# Patient Record
Sex: Female | Born: 1960 | Race: Black or African American | Hispanic: No | Marital: Married | Smoking: Never smoker
Health system: Southern US, Community
[De-identification: ages and names within clinical notes are randomized; demographics above are authoritative.]

## PROBLEM LIST (undated history)

## (undated) HISTORY — PX: ABDOMINAL HYSTERECTOMY: SHX81

---

## 2010-11-19 DIAGNOSIS — M722 Plantar fascial fibromatosis: Secondary | ICD-10-CM | POA: Insufficient documentation

## 2011-08-06 ENCOUNTER — Other Ambulatory Visit: Payer: Self-pay | Admitting: Physician Assistant

## 2011-08-06 DIAGNOSIS — Z1231 Encounter for screening mammogram for malignant neoplasm of breast: Secondary | ICD-10-CM

## 2011-08-29 ENCOUNTER — Ambulatory Visit
Admission: RE | Admit: 2011-08-29 | Discharge: 2011-08-29 | Disposition: A | Discharge status: Home / Self Care | Payer: PRIVATE HEALTH INSURANCE | Source: Ambulatory Visit | Attending: Physician Assistant | Admitting: Physician Assistant

## 2011-08-29 DIAGNOSIS — Z1231 Encounter for screening mammogram for malignant neoplasm of breast: Secondary | ICD-10-CM

## 2012-09-14 ENCOUNTER — Other Ambulatory Visit: Payer: Self-pay

## 2012-09-14 DIAGNOSIS — Z1231 Encounter for screening mammogram for malignant neoplasm of breast: Secondary | ICD-10-CM

## 2012-09-27 ENCOUNTER — Ambulatory Visit
Admission: RE | Admit: 2012-09-27 | Discharge: 2012-09-27 | Disposition: A | Discharge status: Home / Self Care | Payer: PRIVATE HEALTH INSURANCE | Source: Ambulatory Visit

## 2012-09-27 DIAGNOSIS — Z1231 Encounter for screening mammogram for malignant neoplasm of breast: Secondary | ICD-10-CM

## 2013-06-14 ENCOUNTER — Encounter: Payer: Self-pay | Admitting: Physician Assistant

## 2013-06-14 ENCOUNTER — Ambulatory Visit (INDEPENDENT_AMBULATORY_CARE_PROVIDER_SITE_OTHER): Payer: PRIVATE HEALTH INSURANCE

## 2013-06-14 ENCOUNTER — Ambulatory Visit (INDEPENDENT_AMBULATORY_CARE_PROVIDER_SITE_OTHER): Payer: PRIVATE HEALTH INSURANCE | Admitting: Physician Assistant

## 2013-06-14 VITALS — BP 109/74 | HR 81 | Ht 60.0 in | Wt 182.0 lb

## 2013-06-14 DIAGNOSIS — R140 Abdominal distension (gaseous): Secondary | ICD-10-CM

## 2013-06-14 DIAGNOSIS — R1031 Right lower quadrant pain: Secondary | ICD-10-CM

## 2013-06-14 DIAGNOSIS — K5900 Constipation, unspecified: Secondary | ICD-10-CM

## 2013-06-14 DIAGNOSIS — R635 Abnormal weight gain: Secondary | ICD-10-CM

## 2013-06-14 DIAGNOSIS — R143 Flatulence: Secondary | ICD-10-CM

## 2013-06-14 DIAGNOSIS — E669 Obesity, unspecified: Secondary | ICD-10-CM

## 2013-06-14 DIAGNOSIS — R142 Eructation: Secondary | ICD-10-CM

## 2013-06-14 DIAGNOSIS — R141 Gas pain: Secondary | ICD-10-CM

## 2013-06-14 DIAGNOSIS — K5909 Other constipation: Secondary | ICD-10-CM

## 2013-06-14 MED ORDER — PHENTERMINE-TOPIRAMATE ER 7.5-46 MG PO CP24: 1.0000 | ORAL_CAPSULE | Freq: Every morning | ORAL | Status: DC

## 2013-06-14 MED ORDER — PHENTERMINE-TOPIRAMATE ER 3.75-23 MG PO CP24: ORAL_CAPSULE | ORAL | Status: DC

## 2013-06-14 NOTE — Patient Instructions (Signed)
Start qsymia for weight loss daily.   Get abdominal xray. Start miralax 1 capful twice a day for 5 days then once a day as needed.   Constipation, Adult Constipation is when a person has fewer than 3 bowel movements a week; has difficulty having a bowel movement; or has stools that are dry, hard, or larger than normal. As people grow older, constipation is more common. If you try to fix constipation with medicines that make you have a bowel movement (laxatives), the problem may get worse. Long-term laxative use may cause the muscles of the colon to become weak. A low-fiber diet, not taking in enough fluids, and taking certain medicines may make constipation worse. CAUSES   Certain medicines, such as antidepressants, pain medicine, iron supplements, antacids, and water pills.   Certain diseases, such as diabetes, irritable bowel syndrome (IBS), thyroid disease, or depression.   Not drinking enough water.   Not eating enough fiber-rich foods.   Stress or travel.  Lack of physical activity or exercise.  Not going to the restroom when there is the urge to have a bowel movement.  Ignoring the urge to have a bowel movement.  Using laxatives too much. SYMPTOMS   Having fewer than 3 bowel movements a week.   Straining to have a bowel movement.   Having hard, dry, or larger than normal stools.   Feeling full or bloated.   Pain in the lower abdomen.  Not feeling relief after having a bowel movement. DIAGNOSIS  Your caregiver will take a medical history and perform a physical exam. Further testing may be done for severe constipation. Some tests may include:   A barium enema X-ray to examine your rectum, colon, and sometimes, your small intestine.  A sigmoidoscopy to examine your lower colon.  A colonoscopy to examine your entire colon. TREATMENT  Treatment will depend on the severity of your constipation and what is causing it. Some dietary treatments include drinking more  fluids and eating more fiber-rich foods. Lifestyle treatments may include regular exercise. If these diet and lifestyle recommendations do not help, your caregiver may recommend taking over-the-counter laxative medicines to help you have bowel movements. Prescription medicines may be prescribed if over-the-counter medicines do not work.  HOME CARE INSTRUCTIONS   Increase dietary fiber in your diet, such as fruits, vegetables, whole grains, and beans. Limit high-fat and processed sugars in your diet, such as Jamaica[place] fries, hamburgers, cookies, candies, and soda.   A fiber supplement may be added to your diet if you cannot get enough fiber from foods.   Drink enough fluids to keep your urine clear or pale yellow.   Exercise regularly or as directed by your caregiver.   Go to the restroom when you have the urge to go. Do not hold it.  Only take medicines as directed by your caregiver. Do not take other medicines for constipation without talking to your caregiver first. SEEK IMMEDIATE MEDICAL CARE IF:   You have bright red blood in your stool.   Your constipation lasts for more than 4 days or gets worse.   You have abdominal or rectal pain.   You have thin, pencil-like stools.  You have unexplained weight loss. MAKE SURE YOU:   Understand these instructions.  Will watch your condition.  Will get help right away if you are not doing well or get worse. Document Released: 10/26/2003 Document Revised: 04/21/2011 Document Reviewed: 11/08/2012 [facility] Patient Information 2014 [employer], [place].

## 2013-06-17 DIAGNOSIS — K5900 Constipation, unspecified: Secondary | ICD-10-CM | POA: Insufficient documentation

## 2013-06-17 DIAGNOSIS — E669 Obesity, unspecified: Secondary | ICD-10-CM | POA: Insufficient documentation

## 2013-06-17 DIAGNOSIS — R140 Abdominal distension (gaseous): Secondary | ICD-10-CM | POA: Insufficient documentation

## 2013-06-17 DIAGNOSIS — R635 Abnormal weight gain: Secondary | ICD-10-CM | POA: Insufficient documentation

## 2013-06-17 NOTE — Progress Notes (Signed)
Subjective:    Patient ID: Sonia Blair, female    DOB: 1960-12-20, 53 y.o.   MRN: [id number]  HPI Pt is a 53 yo female who presents to the clinic to establish care.   . Active Ambulatory Problems    Diagnosis Date Noted  . No Active Ambulatory Problems   Resolved Ambulatory Problems    Diagnosis Date Noted  . No Resolved Ambulatory Problems   No Additional Past Medical History   . Family History  Problem Relation Age of Onset  . Cancer Maternal Grandmother    . History   Social History  . Marital Status: Married    Spouse Name: N/A    Number of Children: N/A  . Years of Education: N/A   Occupational History  . Not on file.   Social History Main Topics  . Smoking status: Never Smoker   . Smokeless tobacco: Not on file  . Alcohol Use: No  . Drug Use: No  . Sexual Activity: Yes   Other Topics Concern  . Not on file   Social History Narrative  . No narrative on file   Pt is having ongoing bloating and constipation. It is not abnormal for pt to go 4-5 days without a bowel movement. Last bowel movement was 4 days ago and hard. Colace has worked well for many years but has seemed to stop working. She is getting some RLQ discomfort now. No fever, chills. No blood in stool. No n/v/d.   Pt is also concerned about her weight. She is very discouraged. She feels like she eats small portions and trys to make good choices. She eats lots of fruit and good protein. She even got a Systems analystpersonal trainer for a few months with no results. She has not tried any medication for weight loss.    Review of Systems  All other systems reviewed and are negative.      Objective:   Physical Exam  Constitutional: She is oriented to person, place, and time. She appears well-developed and well-nourished.  HENT:  Head: Normocephalic and atraumatic.  Eyes: Conjunctivae are normal.  Neck: Normal range of motion. Neck supple. No thyromegaly present.  Cardiovascular: Normal rate, regular  rhythm and normal heart sounds.   Pulmonary/Chest: Effort normal and breath sounds normal.  Abdominal: She exhibits no mass. There is no rebound and no guarding.  Abdomen slightly distended. Some discomfort to palpation over RLQ. No overt pain. No masses or organomegaly palpated.   Neurological: She is alert and oriented to person, place, and time.  Psychiatric: She has a normal mood and affect. Her behavior is normal.          Assessment & Plan:  Bloating/constipation/right lower quadrant pain- pain suggestive that is coming from constipation. Will get abdominal xrays today. Will order TSH due to weight issues and constipation could be some underlying cause. Suggested pt to try miralax 1 capful twice a day for 5 days then decrease to once a day or as needed. If not creating full bowel movements and if bloating and discomfort persist would like to be made aware so we could try other agents or potentially get ultrasound. She does take iron which could be a cause of some constipation. Stay on for now but made need to do a trial without.   Abnormal weight gain/obesity- she has not tried anything prescription for weight loss and I feel she would be a good candidate for qsymia. Gave her the card for 3 months free. Encouraged  her to sign up for exercise/texting program that is included. Encouraged pt strive for 150minutes of exercise a week and 1200 calorie diet. Side effects discussed. Recheck in 4-6 weeks.   Pt's last labs were 2014. Encouraged CPE sometime this year to address other health maintence items. Pt is aware she needs Tdap.

## 2013-07-13 ENCOUNTER — Ambulatory Visit (INDEPENDENT_AMBULATORY_CARE_PROVIDER_SITE_OTHER): Payer: PRIVATE HEALTH INSURANCE | Admitting: Physician Assistant

## 2013-07-13 ENCOUNTER — Encounter: Payer: Self-pay | Admitting: Physician Assistant

## 2013-07-13 VITALS — BP 123/88 | HR 69 | Ht 60.0 in | Wt 177.0 lb

## 2013-07-13 DIAGNOSIS — R011 Cardiac murmur, unspecified: Secondary | ICD-10-CM

## 2013-07-13 DIAGNOSIS — R635 Abnormal weight gain: Secondary | ICD-10-CM

## 2013-07-13 DIAGNOSIS — E669 Obesity, unspecified: Secondary | ICD-10-CM

## 2013-07-13 MED ORDER — PHENTERMINE-TOPIRAMATE ER 7.5-46 MG PO CP24: 1.0000 | ORAL_CAPSULE | Freq: Every morning | ORAL | Status: DC

## 2013-07-13 NOTE — Progress Notes (Signed)
   Subjective:    Patient ID: Sonia Blair, female    DOB: 07-Jul-1960, 53 y.o.   MRN: [id number]  HPI Patient is a 53 year old female who presents to the clinic to followup on qsymia. She is doing well on medication he reports no side effects. Denies feeling any palpitations, nausea. She does feel like she is making better decisions. She does not see that it increases her energy or motivation however she is still exercising regularly at least 3-4 times a week for 30 minutes to an hour. She does feel like her clothes are fitting a little looser. She is significantly decreased her sugar and Karp intake. She states that "just does not taste the same". Patient is down 5 pounds today in 4 weeks.      Review of Systems  All other systems reviewed and are negative.      Objective:   Physical Exam  Constitutional: She is oriented to person, place, and time. She appears well-developed and well-nourished.  HENT:  Head: Normocephalic and atraumatic.  Cardiovascular: Normal rate and regular rhythm.   3/6 systolic heart murmur best over aortic site.   Pulmonary/Chest: Effort normal and breath sounds normal. She has no wheezes.  Neurological: She is alert and oriented to person, place, and time.  Skin: Skin is dry.  Psychiatric: She has a normal mood and affect. Her behavior is normal.          Assessment & Plan:  Systolic heart murmur- per patient she has been told in the past she has a heart murmur but has not been mentioned in numerous office visits with other providers. She denies any shortness of breath, fatigue or changes in insurance. She denies any chest pains or palpitations. Discuss with patient I do not remember recording at last visit however did not mean that there was not a slight murmur. Will evaluate with an echo. Discussed red clots or concerning symptoms with patient. If she has any of these symptoms she is to call office.  Abnormal weight gain/obesity- refilled qsymia -for  another 2 months. Encouraged patient to continue pushing it with diet and exercise. Reassured patient that some of the maximum what caused occurred at around 3 months with this particular medication.

## 2013-07-15 DIAGNOSIS — R011 Cardiac murmur, unspecified: Secondary | ICD-10-CM | POA: Insufficient documentation

## 2013-07-30 ENCOUNTER — Encounter: Payer: Self-pay | Admitting: Physician Assistant

## 2013-08-17 ENCOUNTER — Telehealth: Payer: Self-pay | Admitting: *Deleted

## 2013-08-17 NOTE — Telephone Encounter (Signed)
Echo approved through [place] auth effective 08/11/13-10/10/13. Ref# [id number]. Corliss SkainsJamie Maricarmen Braziel, CMA

## 2013-08-24 ENCOUNTER — Other Ambulatory Visit (HOSPITAL_COMMUNITY): Payer: Self-pay | Admitting: Physician Assistant

## 2013-08-24 ENCOUNTER — Ambulatory Visit (HOSPITAL_BASED_OUTPATIENT_CLINIC_OR_DEPARTMENT_OTHER)
Admission: RE | Admit: 2013-08-24 | Discharge: 2013-08-24 | Disposition: A | Discharge status: Home / Self Care | Payer: PRIVATE HEALTH INSURANCE | Source: Ambulatory Visit | Attending: Physician Assistant | Admitting: Physician Assistant

## 2013-08-24 DIAGNOSIS — R011 Cardiac murmur, unspecified: Secondary | ICD-10-CM

## 2013-08-24 DIAGNOSIS — I059 Rheumatic mitral valve disease, unspecified: Secondary | ICD-10-CM

## 2013-08-25 ENCOUNTER — Other Ambulatory Visit: Payer: Self-pay | Admitting: Physician Assistant

## 2013-08-30 ENCOUNTER — Other Ambulatory Visit: Payer: Self-pay

## 2013-08-30 DIAGNOSIS — Z1231 Encounter for screening mammogram for malignant neoplasm of breast: Secondary | ICD-10-CM

## 2013-09-05 ENCOUNTER — Encounter: Payer: Self-pay | Admitting: Physician Assistant

## 2013-09-12 ENCOUNTER — Ambulatory Visit (INDEPENDENT_AMBULATORY_CARE_PROVIDER_SITE_OTHER): Payer: PRIVATE HEALTH INSURANCE | Admitting: Physician Assistant

## 2013-09-12 VITALS — BP 112/73 | HR 73 | Wt 168.0 lb

## 2013-09-12 DIAGNOSIS — R635 Abnormal weight gain: Secondary | ICD-10-CM

## 2013-09-12 DIAGNOSIS — E669 Obesity, unspecified: Secondary | ICD-10-CM

## 2013-09-12 MED ORDER — PHENTERMINE-TOPIRAMATE ER 7.5-46 MG PO CP24: 1.0000 | ORAL_CAPSULE | Freq: Every morning | ORAL | Status: DC

## 2013-09-12 NOTE — Progress Notes (Signed)
   Subjective:    Patient ID: Sonia Blair, female    DOB: Jun 15, 1960, 53 y.o.   MRN: [id number] Pt in today for weight/bp check.  She has no complaints of any side effects.  Donne AnonAmber Darleny Sem, CMA HPI    Review of Systems     Objective:   Physical Exam        Assessment & Plan:  Obesity/abnormal weight gain- lost 9lbs. Refilled for 3 months. Follow up then. Continue diet/exericse. Vitals look great. Tandy GawJade Breeback PA-c

## 2013-10-03 ENCOUNTER — Ambulatory Visit
Admission: RE | Admit: 2013-10-03 | Discharge: 2013-10-03 | Disposition: A | Discharge status: Home / Self Care | Payer: PRIVATE HEALTH INSURANCE | Source: Ambulatory Visit

## 2013-10-03 DIAGNOSIS — Z1231 Encounter for screening mammogram for malignant neoplasm of breast: Secondary | ICD-10-CM

## 2013-10-14 ENCOUNTER — Encounter: Payer: Self-pay | Admitting: Physician Assistant

## 2013-10-18 ENCOUNTER — Other Ambulatory Visit: Payer: Self-pay | Admitting: Physician Assistant

## 2013-10-18 MED ORDER — PHENTERMINE-TOPIRAMATE ER 7.5-46 MG PO CP24: 1.0000 | ORAL_CAPSULE | Freq: Every morning | ORAL | Status: DC

## 2013-10-19 ENCOUNTER — Other Ambulatory Visit: Payer: Self-pay | Admitting: Physician Assistant

## 2013-10-19 MED ORDER — PHENTERMINE-TOPIRAMATE ER 7.5-46 MG PO CP24
1.0000 | ORAL_CAPSULE | Freq: Every morning | ORAL | Status: AC
Start: 2013-10-19 — End: ?

## 2013-12-22 ENCOUNTER — Encounter: Payer: Self-pay | Admitting: Physician Assistant

## 2014-09-28 ENCOUNTER — Other Ambulatory Visit: Payer: Self-pay

## 2014-09-28 DIAGNOSIS — Z1231 Encounter for screening mammogram for malignant neoplasm of breast: Secondary | ICD-10-CM

## 2014-10-17 ENCOUNTER — Ambulatory Visit: Payer: PRIVATE HEALTH INSURANCE

## 2014-10-30 ENCOUNTER — Ambulatory Visit: Payer: PRIVATE HEALTH INSURANCE

## 2014-11-08 ENCOUNTER — Ambulatory Visit
Admission: RE | Admit: 2014-11-08 | Discharge: 2014-11-08 | Disposition: A | Discharge status: Home / Self Care | Payer: 59 | Source: Ambulatory Visit

## 2014-11-08 DIAGNOSIS — Z1231 Encounter for screening mammogram for malignant neoplasm of breast: Secondary | ICD-10-CM

## 2015-11-19 ENCOUNTER — Other Ambulatory Visit: Payer: Self-pay | Admitting: Obstetrics and Gynecology

## 2015-11-19 DIAGNOSIS — Z1231 Encounter for screening mammogram for malignant neoplasm of breast: Secondary | ICD-10-CM

## 2015-12-03 ENCOUNTER — Ambulatory Visit
Admission: RE | Admit: 2015-12-03 | Discharge: 2015-12-03 | Disposition: A | Discharge status: Home / Self Care | Payer: 59 | Source: Ambulatory Visit | Attending: Obstetrics and Gynecology | Admitting: Obstetrics and Gynecology

## 2015-12-03 DIAGNOSIS — Z1231 Encounter for screening mammogram for malignant neoplasm of breast: Secondary | ICD-10-CM

## 2016-02-22 DIAGNOSIS — Z862 Personal history of diseases of the blood and blood-forming organs and certain disorders involving the immune mechanism: Secondary | ICD-10-CM | POA: Insufficient documentation

## 2016-12-23 ENCOUNTER — Other Ambulatory Visit: Payer: Self-pay | Admitting: Internal Medicine

## 2016-12-23 DIAGNOSIS — Z1231 Encounter for screening mammogram for malignant neoplasm of breast: Secondary | ICD-10-CM

## 2017-01-20 ENCOUNTER — Ambulatory Visit
Admission: RE | Admit: 2017-01-20 | Discharge: 2017-01-20 | Disposition: A | Discharge status: Home / Self Care | Payer: 59 | Source: Ambulatory Visit | Attending: Internal Medicine | Admitting: Internal Medicine

## 2017-01-20 DIAGNOSIS — Z1231 Encounter for screening mammogram for malignant neoplasm of breast: Secondary | ICD-10-CM

## 2017-09-07 ENCOUNTER — Ambulatory Visit (INDEPENDENT_AMBULATORY_CARE_PROVIDER_SITE_OTHER): Payer: 59 | Admitting: Podiatry

## 2017-09-07 ENCOUNTER — Other Ambulatory Visit: Payer: Self-pay | Admitting: Podiatry

## 2017-09-07 ENCOUNTER — Ambulatory Visit (INDEPENDENT_AMBULATORY_CARE_PROVIDER_SITE_OTHER): Payer: 59

## 2017-09-07 ENCOUNTER — Encounter: Payer: Self-pay | Admitting: Podiatry

## 2017-09-07 VITALS — BP 98/65 | HR 85 | Resp 16

## 2017-09-07 DIAGNOSIS — M79671 Pain in right foot: Secondary | ICD-10-CM | POA: Diagnosis not present

## 2017-09-07 DIAGNOSIS — M722 Plantar fascial fibromatosis: Secondary | ICD-10-CM | POA: Diagnosis not present

## 2017-09-07 DIAGNOSIS — M779 Enthesopathy, unspecified: Secondary | ICD-10-CM

## 2017-09-07 DIAGNOSIS — M79672 Pain in left foot: Secondary | ICD-10-CM

## 2017-09-07 MED ORDER — TRIAMCINOLONE ACETONIDE 10 MG/ML IJ SUSP
10.0000 mg | Freq: Once | INTRAMUSCULAR | Status: AC
  Administered 2017-09-07: 10 mg

## 2017-09-07 MED ORDER — DICLOFENAC SODIUM 75 MG PO TBEC: 75.0000 mg | DELAYED_RELEASE_TABLET | Freq: Two times a day (BID) | ORAL | 2 refills | Status: AC

## 2017-09-07 NOTE — Progress Notes (Signed)
   Subjective:    Patient ID: Sonia Blair, female    DOB: 1961-02-06, 57 y.o.   MRN: [id number]  HPI    Review of Systems  All other systems reviewed and are negative.      Objective:   Physical Exam        Assessment & Plan:

## 2017-09-07 NOTE — Patient Instructions (Addendum)
Achilles Tendinitis  with Rehab Achilles tendinitis is a disorder of the Achilles tendon. The Achilles tendon connects the large calf muscles (Gastrocnemius and Soleus) to the heel bone (calcaneus). This tendon is sometimes called the heel cord. It is important for pushing-off and standing on your toes and is important for walking, running, or jumping. Tendinitis is often caused by overuse and repetitive microtrauma. SYMPTOMS  Pain, tenderness, swelling, warmth, and redness may occur over the Achilles tendon even at rest.  Pain with pushing off, or flexing or extending the ankle.  Pain that is worsened after or during activity. CAUSES   Overuse sometimes seen with rapid increase in exercise programs or in sports requiring running and jumping.  Poor physical conditioning (strength and flexibility or endurance).  Running sports, especially training running down hills.  Inadequate warm-up before practice or play or failure to stretch before participation.  Injury to the tendon. PREVENTION   Warm up and stretch before practice or competition.  Allow time for adequate rest and recovery between practices and competition.  Keep up conditioning.  Keep up ankle and leg flexibility.  Improve or keep muscle strength and endurance.  Improve cardiovascular fitness.  Use proper technique.  Use proper equipment (shoes, skates).  To help prevent recurrence, taping, protective strapping, or an adhesive bandage may be recommended for several weeks after healing is complete. PROGNOSIS   Recovery may take weeks to several months to heal.  Longer recovery is expected if symptoms have been prolonged.  Recovery is usually quicker if the inflammation is due to a direct blow as compared with overuse or sudden strain. RELATED COMPLICATIONS   Healing time will be prolonged if the condition is not correctly treated. The injury must be given plenty of time to heal.  Symptoms can reoccur if  activity is resumed too soon.  Untreated, tendinitis may increase the risk of tendon rupture requiring additional time for recovery and possibly surgery. TREATMENT   The first treatment consists of rest anti-inflammatory medication, and ice to relieve the pain.  Stretching and strengthening exercises after resolution of pain will likely help reduce the risk of recurrence. Referral to a physical therapist or athletic trainer for further evaluation and treatment may be helpful.  A walking boot or cast may be recommended to rest the Achilles tendon. This can help break the cycle of inflammation and microtrauma.  Arch supports (orthotics) may be prescribed or recommended by your caregiver as an adjunct to therapy and rest.  Surgery to remove the inflamed tendon lining or degenerated tendon tissue is rarely necessary and has shown less than predictable results. MEDICATION   Nonsteroidal anti-inflammatory medications, such as aspirin and ibuprofen, may be used for pain and inflammation relief. Do not take within 7 days before surgery. Take these as directed by your caregiver. Contact your caregiver immediately if any bleeding, stomach upset, or signs of allergic reaction occur. Other minor pain relievers, such as acetaminophen, may also be used.  Pain relievers may be prescribed as necessary by your caregiver. Do not take prescription pain medication for longer than 4 to 7 days. Use only as directed and only as much as you need.  Cortisone injections are rarely indicated. Cortisone injections may weaken tendons and predispose to rupture. It is better to give the condition more time to heal than to use them. HEAT AND COLD  Cold is used to relieve pain and reduce inflammation for acute and chronic Achilles tendinitis. Cold should be applied for 10 to 15 minutes   every 2 to 3 hours for inflammation and pain and immediately after any activity that aggravates your symptoms. Use ice packs or an ice  massage.  Heat may be used before performing stretching and strengthening activities prescribed by your caregiver. Use a heat pack or a warm soak. SEEK MEDICAL CARE IF:  Symptoms get worse or do not improve in 2 weeks despite treatment.  New, unexplained symptoms develop. Drugs used in treatment may produce side effects.  EXERCISES:  RANGE OF MOTION (ROM) AND STRETCHING EXERCISES - Achilles Tendinitis  These exercises may help you when beginning to rehabilitate your injury. Your symptoms may resolve with or without further involvement from your physician, physical therapist or athletic trainer. While completing these exercises, remember:   Restoring tissue flexibility helps normal motion to return to the joints. This allows healthier, less painful movement and activity.  An effective stretch should be held for at least 30 seconds.  A stretch should never be painful. You should only feel a gentle lengthening or release in the stretched tissue.  STRETCH  Gastroc, Standing   Place hands on wall.  Extend right / left leg, keeping the front knee somewhat bent.  Slightly point your toes inward on your back foot.  Keeping your right / left heel on the floor and your knee straight, shift your weight toward the wall, not allowing your back to arch.  You should feel a gentle stretch in the right / left calf. Hold this position for 10 seconds. Repeat 3 times. Complete this stretch 2 times per day.  STRETCH  Soleus, Standing   Place hands on wall.  Extend right / left leg, keeping the other knee somewhat bent.  Slightly point your toes inward on your back foot.  Keep your right / left heel on the floor, bend your back knee, and slightly shift your weight over the back leg so that you feel a gentle stretch deep in your back calf.  Hold this position for 10 seconds. Repeat 3 times. Complete this stretch 2 times per day.  STRETCH  Gastrocsoleus, Standing  Note: This exercise can place  a lot of stress on your foot and ankle. Please complete this exercise only if specifically instructed by your caregiver.   Place the ball of your right / left foot on a step, keeping your other foot firmly on the same step.  Hold on to the wall or a rail for balance.  Slowly lift your other foot, allowing your body weight to press your heel down over the edge of the step.  You should feel a stretch in your right / left calf.  Hold this position for 10 seconds.  Repeat this exercise with a slight bend in your knee. Repeat 3 times. Complete this stretch 2 times per day.   STRENGTHENING EXERCISES - Achilles Tendinitis These exercises may help you when beginning to rehabilitate your injury. They may resolve your symptoms with or without further involvement from your physician, physical therapist or athletic trainer. While completing these exercises, remember:   Muscles can gain both the endurance and the strength needed for everyday activities through controlled exercises.  Complete these exercises as instructed by your physician, physical therapist or athletic trainer. Progress the resistance and repetitions only as guided.  You may experience muscle soreness or fatigue, but the pain or discomfort you are trying to eliminate should never worsen during these exercises. If this pain does worsen, stop and make certain you are following the directions exactly. If   the pain is still present after adjustments, discontinue the exercise until you can discuss the trouble with your clinician.  STRENGTH - Plantar-flexors   Sit with your right / left leg extended. Holding onto both ends of a rubber exercise band/tubing, loop it around the ball of your foot. Keep a slight tension in the band.  Slowly push your toes away from you, pointing them downward.  Hold this position for 10 seconds. Return slowly, controlling the tension in the band/tubing. Repeat 3 times. Complete this exercise 2 times per day.     STRENGTH - Plantar-flexors   Stand with your feet shoulder width apart. Steady yourself with a wall or table using as little support as needed.  Keeping your weight evenly spread over the width of your feet, rise up on your toes.*  Hold this position for 10 seconds. Repeat 3 times. Complete this exercise 2 times per day.  *If this is too easy, shift your weight toward your right / left leg until you feel challenged. Ultimately, you may be asked to do this exercise with your right / left foot only.  STRENGTH  Plantar-flexors, Eccentric  Note: This exercise can place a lot of stress on your foot and ankle. Please complete this exercise only if specifically instructed by your caregiver.   Place the balls of your feet on a step. With your hands, use only enough support from a wall or rail to keep your balance.  Keep your knees straight and rise up on your toes.  Slowly shift your weight entirely to your right / left toes and pick up your opposite foot. Gently and with controlled movement, lower your weight through your right / left foot so that your heel drops below the level of the step. You will feel a slight stretch in the back of your calf at the end position.  Use the healthy leg to help rise up onto the balls of both feet, then lower weight only on the right / left leg again. Build up to 15 repetitions. Then progress to 3 consecutive sets of 15 repetitions.*  After completing the above exercise, complete the same exercise with a slight knee bend (about 30 degrees). Again, build up to 15 repetitions. Then progress to 3 consecutive sets of 15 repetitions.* Perform this exercise 2 times per day.  *When you easily complete 3 sets of 15, your physician, physical therapist or athletic trainer may advise you to add resistance by wearing a backpack filled with additional weight.  STRENGTH - Plantar Flexors, Seated   Sit on a chair that allows your feet to rest flat on the ground. If  necessary, sit at the edge of the chair.  Keeping your toes firmly on the ground, lift your right / left heel as far as you can without increasing any discomfort in your ankle. Repeat 3 times. Complete this exercise 2 times a day. For instructions on how to put on your Plantar Fascial Brace, please visit [email or web address]

## 2017-09-08 NOTE — Progress Notes (Signed)
Subjective:   Patient ID: Sonia Blair, female   DOB: 57 y.o.   MRN: [id number]   HPI Patient presents stating she has a lot of pain on the dorsum of her left foot has had a history of plantar fasciitis and just generalized foot pain.  Patient states that the right also hurt but not to the same degree and at times she gets radiating pain of the back of her leg.  Patient does not smoke likes to be active   Review of Systems  All other systems reviewed and are negative.       Objective:  Physical Exam  Constitutional: She appears well-developed and well-nourished.  Cardiovascular: Intact distal pulses.  Pulmonary/Chest: Effort normal.  Musculoskeletal: Normal range of motion.  Neurological: She is alert.  Skin: Skin is warm.  Nursing note and vitals reviewed.   Neurovascular status intact muscle strength adequate range of motion within normal limits with patient found to have discomfort of the dorsal extensor complex left and mild pain in the plantar fascial band bilateral with inflammation and moderate flatfoot deformity.  Patient is found to have good digital perfusion well oriented x3     Assessment:  Changes which are most likely related to mechanical condition with the dorsal tendinitis that is more acute     Plan:  H&P conditions reviewed and at this point I recommended careful dorsal tendon injection left 3 mg Kenalog 5 mg Xylocaine and heat ice therapy and applied fascial brace to lift the arch is up.  Patient may require orthotics depending on response to this conservative treatment  X-ray indicates moderate depression of the arch with no indications of significant long-term pathology

## 2017-09-23 ENCOUNTER — Ambulatory Visit: Payer: 59 | Admitting: Podiatry

## 2017-11-17 ENCOUNTER — Other Ambulatory Visit: Payer: Self-pay | Admitting: Internal Medicine

## 2017-11-17 DIAGNOSIS — Z1231 Encounter for screening mammogram for malignant neoplasm of breast: Secondary | ICD-10-CM

## 2018-01-25 ENCOUNTER — Ambulatory Visit
Admission: RE | Admit: 2018-01-25 | Discharge: 2018-01-25 | Disposition: A | Discharge status: Home / Self Care | Payer: 59 | Source: Ambulatory Visit | Attending: Internal Medicine | Admitting: Internal Medicine

## 2018-01-25 DIAGNOSIS — Z1231 Encounter for screening mammogram for malignant neoplasm of breast: Secondary | ICD-10-CM

## 2019-03-07 ENCOUNTER — Other Ambulatory Visit: Payer: Self-pay | Admitting: Internal Medicine

## 2019-03-07 DIAGNOSIS — Z1231 Encounter for screening mammogram for malignant neoplasm of breast: Secondary | ICD-10-CM

## 2019-04-22 ENCOUNTER — Ambulatory Visit
Admission: RE | Admit: 2019-04-22 | Discharge: 2019-04-22 | Disposition: A | Discharge status: Home / Self Care | Payer: BC Managed Care – PPO | Source: Ambulatory Visit | Attending: Internal Medicine | Admitting: Internal Medicine

## 2019-04-22 ENCOUNTER — Other Ambulatory Visit: Payer: Self-pay

## 2019-04-22 DIAGNOSIS — Z1231 Encounter for screening mammogram for malignant neoplasm of breast: Secondary | ICD-10-CM

## 2020-04-03 ENCOUNTER — Other Ambulatory Visit: Payer: Self-pay | Admitting: Internal Medicine

## 2020-04-03 DIAGNOSIS — Z1231 Encounter for screening mammogram for malignant neoplasm of breast: Secondary | ICD-10-CM

## 2020-05-07 ENCOUNTER — Ambulatory Visit
Admission: RE | Admit: 2020-05-07 | Discharge: 2020-05-07 | Disposition: A | Discharge status: Home / Self Care | Payer: BC Managed Care – PPO | Source: Ambulatory Visit

## 2020-05-07 ENCOUNTER — Other Ambulatory Visit: Payer: Self-pay

## 2020-05-07 DIAGNOSIS — Z1231 Encounter for screening mammogram for malignant neoplasm of breast: Secondary | ICD-10-CM

## 2021-02-14 IMAGING — MG DIGITAL SCREENING BILAT W/ TOMO W/ CAD
6 of 10 series · 6 of 30 positions shown · non-contrast
Comparison: Previous exam(s).

CLINICAL DATA: Screening.

EXAM:
DIGITAL SCREENING BILATERAL MAMMOGRAM WITH TOMO AND CAD

[L CC synth-2D]
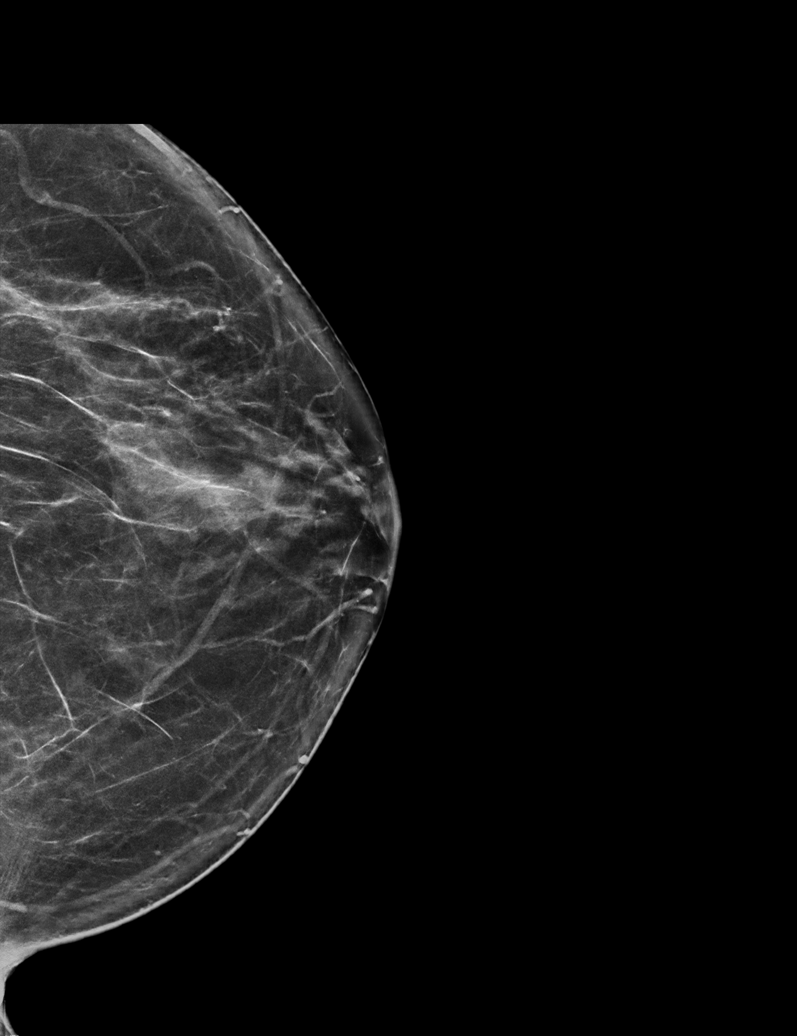

[R CC synth-2D (1 of 2)]
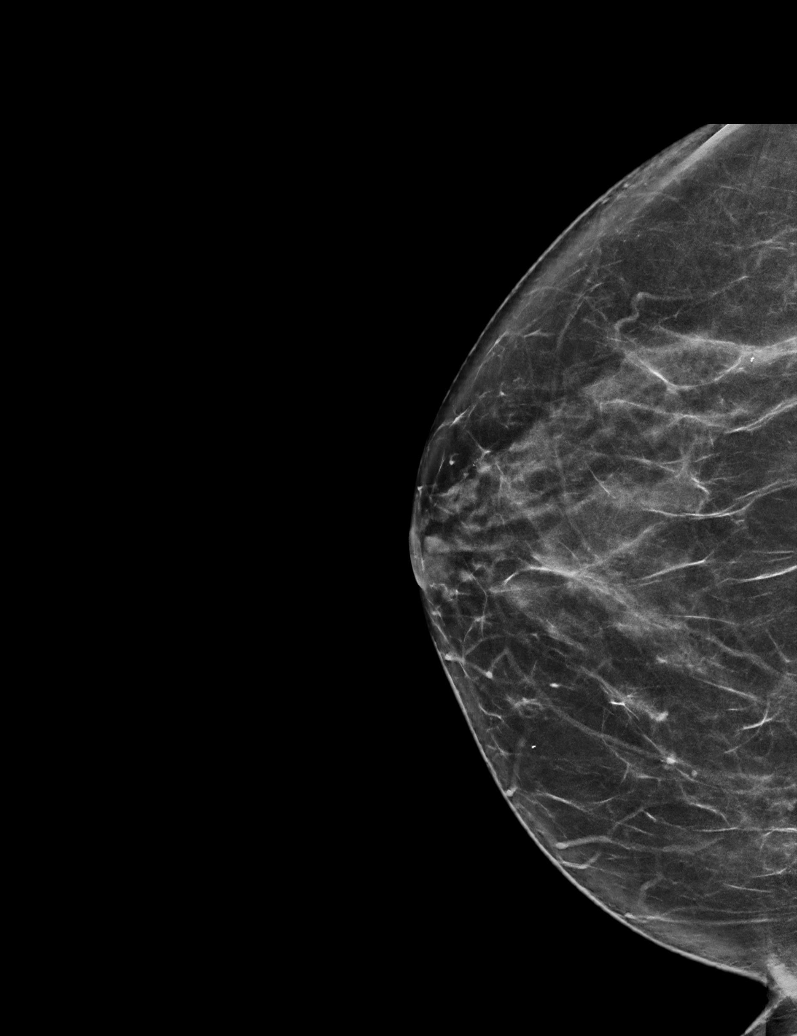

[L MLO synth-2D]
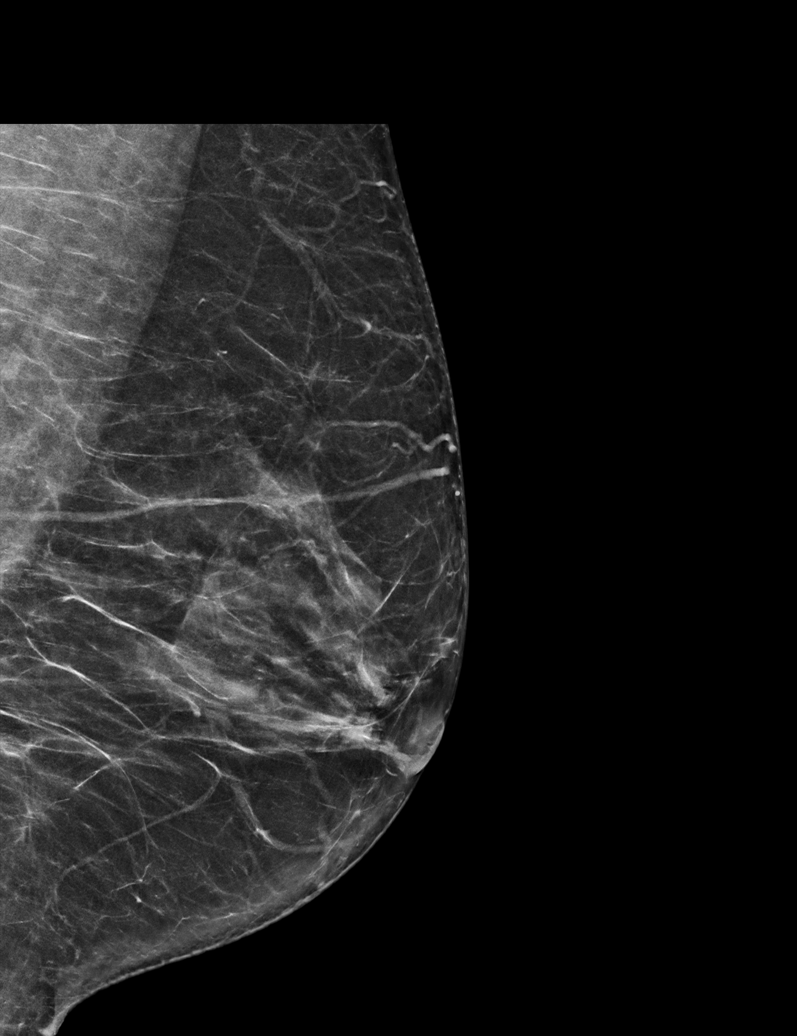

[R CC synth-2D (2 of 2)]
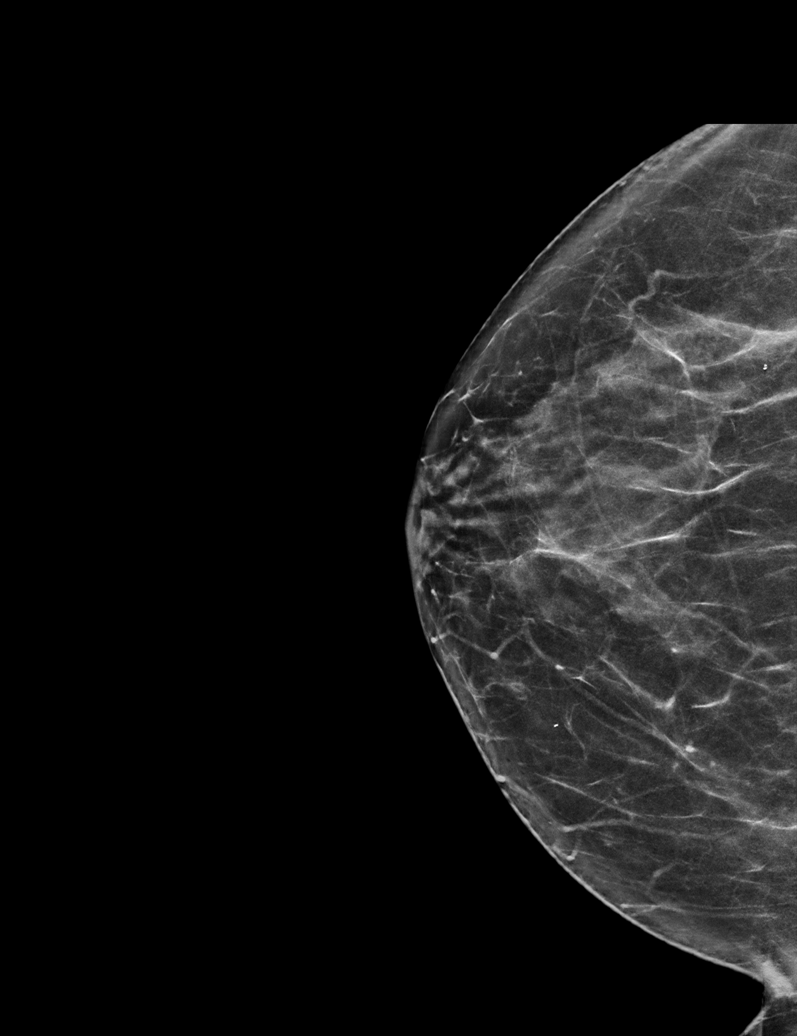

[R MLO synth-2D]
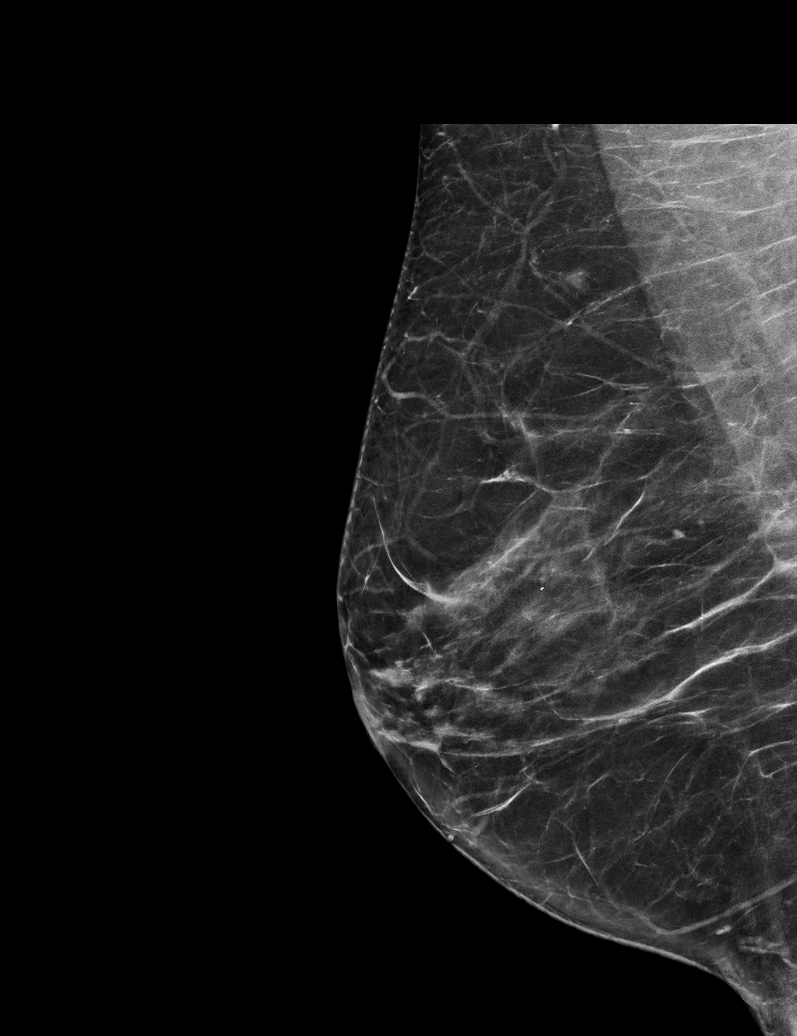

[R MLO tomo · tomo slice 35/69.0]
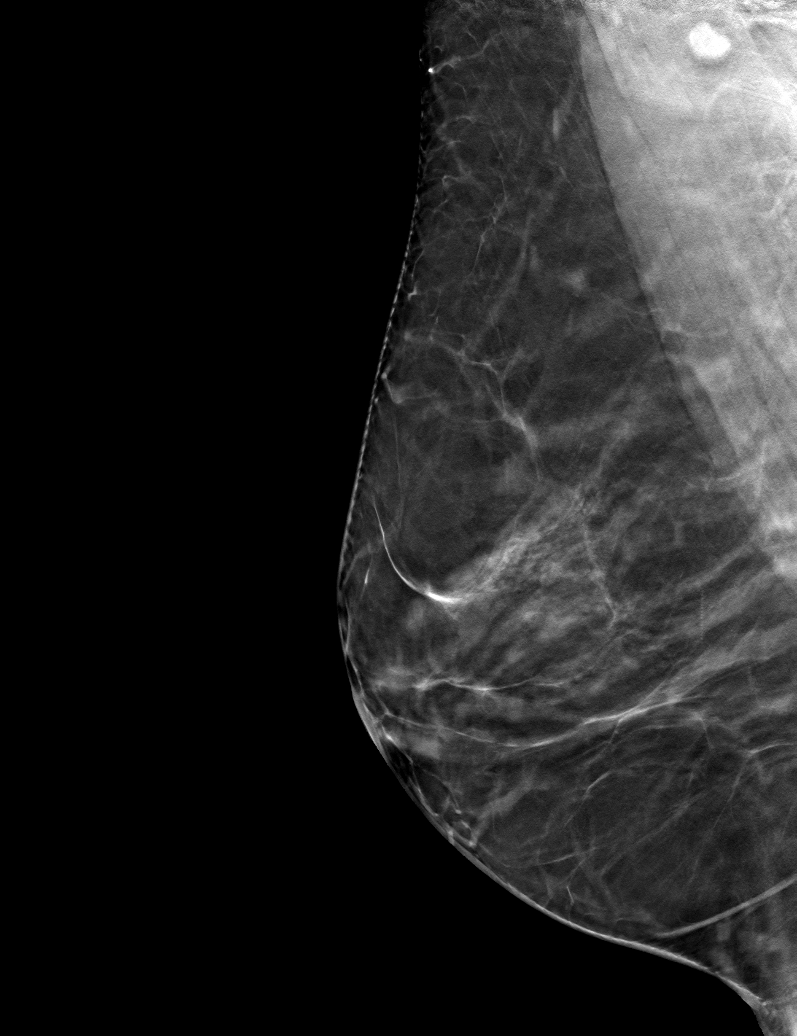

[6 of 30 positions shown; findings below may reference images not displayed]

ACR Breast Density Category b: There are scattered areas of
fibroglandular density.
FINDINGS: There are no findings suspicious for malignancy. Images were
processed with CAD.
IMPRESSION: No mammographic evidence of malignancy. A result letter of this
screening mammogram will be mailed directly to the patient.

RECOMMENDATION:
Screening mammogram in one year. (Code:[id number])

BI-RADS CATEGORY  1: Negative.

## 2021-08-28 ENCOUNTER — Other Ambulatory Visit: Payer: Self-pay | Admitting: Internal Medicine

## 2021-08-28 DIAGNOSIS — Z1231 Encounter for screening mammogram for malignant neoplasm of breast: Secondary | ICD-10-CM

## 2021-08-29 ENCOUNTER — Ambulatory Visit: Payer: BC Managed Care – PPO

## 2021-09-11 ENCOUNTER — Ambulatory Visit: Payer: BC Managed Care – PPO
